# Patient Record
Sex: Female | Born: 1965 | Race: White | Hispanic: No | Marital: Married
Health system: Southern US, Community
[De-identification: ages and names within clinical notes are randomized; demographics above are authoritative.]

## PROBLEM LIST (undated history)

## (undated) DIAGNOSIS — N63 Unspecified lump in unspecified breast: Secondary | ICD-10-CM

---

## 2002-02-23 ENCOUNTER — Other Ambulatory Visit: Admission: RE | Admit: 2002-02-23 | Discharge: 2002-02-23 | Payer: Self-pay | Admitting: Family Medicine

## 2006-10-12 ENCOUNTER — Emergency Department: Payer: Self-pay | Admitting: Emergency Medicine

## 2009-11-26 ENCOUNTER — Ambulatory Visit: Payer: Self-pay

## 2009-12-03 ENCOUNTER — Ambulatory Visit: Payer: Self-pay | Admitting: Unknown Physician Specialty

## 2009-12-17 ENCOUNTER — Ambulatory Visit: Payer: Self-pay | Admitting: Surgery

## 2009-12-23 ENCOUNTER — Ambulatory Visit: Payer: Self-pay | Admitting: Surgery

## 2012-05-26 ENCOUNTER — Ambulatory Visit: Payer: Self-pay

## 2012-05-26 LAB — HEMATOCRIT: HCT: 41 % (ref 35.0–47.0)

## 2012-06-03 ENCOUNTER — Ambulatory Visit: Payer: Self-pay

## 2012-06-03 LAB — PREGNANCY, URINE: Pregnancy Test, Urine: NEGATIVE m[IU]/mL

## 2012-06-20 LAB — PATHOLOGY REPORT

## 2014-10-01 ENCOUNTER — Ambulatory Visit: Payer: Self-pay | Admitting: Unknown Physician Specialty

## 2015-02-19 NOTE — Op Note (Signed)
PATIENT NAME:  Sophia Turner, Sophia Turner MR#:  [id number] DATE OF BIRTH:  October 14, 1966  DATE OF PROCEDURE:  06/03/2012  PREOPERATIVE DIAGNOSIS:  Giant endocervical polyp.   POSTOPERATIVE DIAGNOSIS:  Giant endocervical polyp.   OPERATION PERFORMED: Dilation and curettage, hysteroscopy, removal of polyp.   SURGEON: Wonda Cheng. Laurey Morale, M.D.   OPERATIVE FINDINGS: Giant endocervical polyp.   DESCRIPTION OF PROCEDURE: After adequate general anesthesia, the patient was prepped and draped in routine fashion. The giant endocervical polyp was easily removed by twisting; therefore, a LEEP cone biopsy was not performed. The cervix was dilated with ease. The uterine cavity was systematically visualized with visualization of no obvious abnormality. Endometrial curettage was performed with return of a moderate amount of normal-appearing tissue. The patient tolerated the procedure well and left the operating room in good condition. Sponge and needle counts were said to be correct at the end of the procedure.     ____________________________ Wonda Cheng. Laurey Morale, MD pjr:bjt D: 06/03/2012 09:02:41 ET T: 06/03/2012 12:28:54 ET JOB#: [id number]  cc: Wonda Cheng. Laurey Morale, MD, <Dictator> Rosina Lowenstein MD ELECTRONICALLY SIGNED 06/05/2012 8:27

## 2015-02-25 LAB — SURGICAL PATHOLOGY

## 2016-05-18 ENCOUNTER — Other Ambulatory Visit: Payer: Self-pay | Admitting: Obstetrics and Gynecology

## 2016-05-20 ENCOUNTER — Other Ambulatory Visit: Payer: Self-pay | Admitting: Obstetrics & Gynecology

## 2016-05-20 DIAGNOSIS — N63 Unspecified lump in unspecified breast: Secondary | ICD-10-CM

## 2016-05-28 ENCOUNTER — Ambulatory Visit
Admission: RE | Admit: 2016-05-28 | Discharge: 2016-05-28 | Disposition: A | Discharge status: Home / Self Care | Payer: BLUE CROSS/BLUE SHIELD | Source: Ambulatory Visit | Attending: Obstetrics & Gynecology | Admitting: Obstetrics & Gynecology

## 2016-05-28 DIAGNOSIS — N63 Unspecified lump in unspecified breast: Secondary | ICD-10-CM

## 2016-05-28 HISTORY — DX: Unspecified lump in unspecified breast: N63.0

## 2017-08-30 DIAGNOSIS — D239 Other benign neoplasm of skin, unspecified: Secondary | ICD-10-CM

## 2017-08-30 HISTORY — DX: Other benign neoplasm of skin, unspecified: D23.9

## 2018-04-15 IMAGING — MG MM DIGITAL DIAGNOSTIC BILAT W/ TOMO W/ CAD
8 of 15 series · 8 of 35 positions shown · non-contrast
Comparison: Previous exam(s).

ADDENDUM:
This addendum is created to correct an error in the "RECOMMENDATION"
section. It is noted that the patient is currently 50 years old.

RECOMMENDATION: Screening mammogram in one year.(Code:45-2-BLR)
CLINICAL DATA: Palpable area of concern in the 6 o'clock region of
the right breast. Due for annual exam.
EXAM:
2D DIGITAL DIAGNOSTIC BILATERAL MAMMOGRAM WITH ADJUNCT TOMO
ULTRASOUND RIGHT BREAST

[R TAN synth-2D]
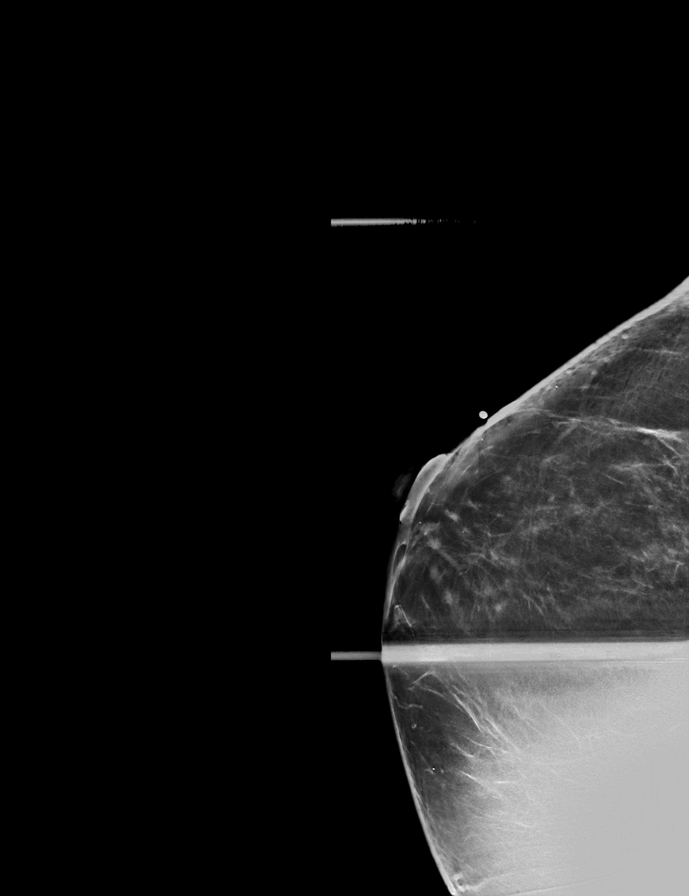

[L MLO]
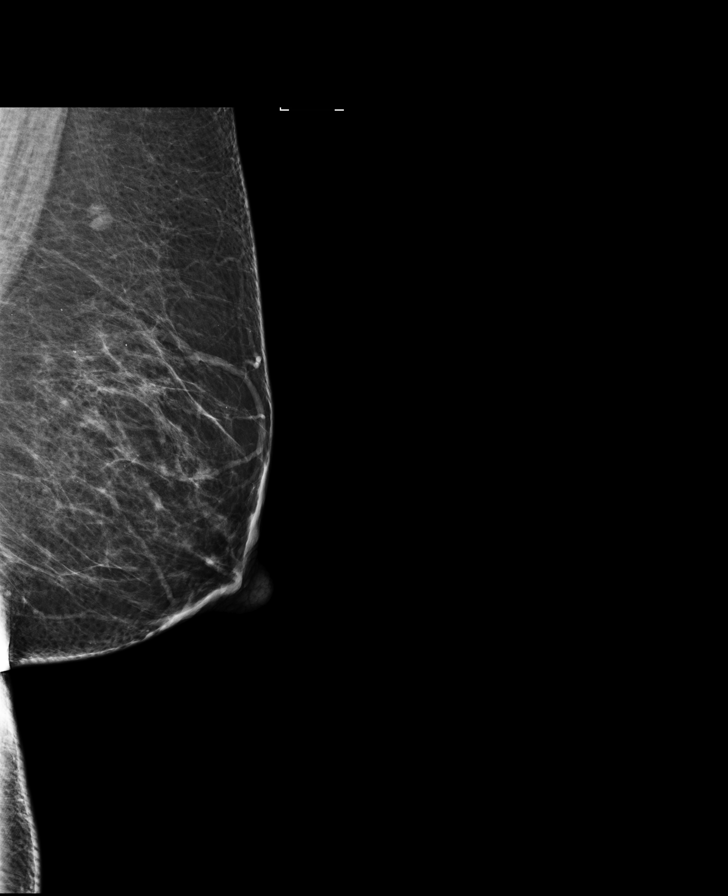

[R CC synth-2D]
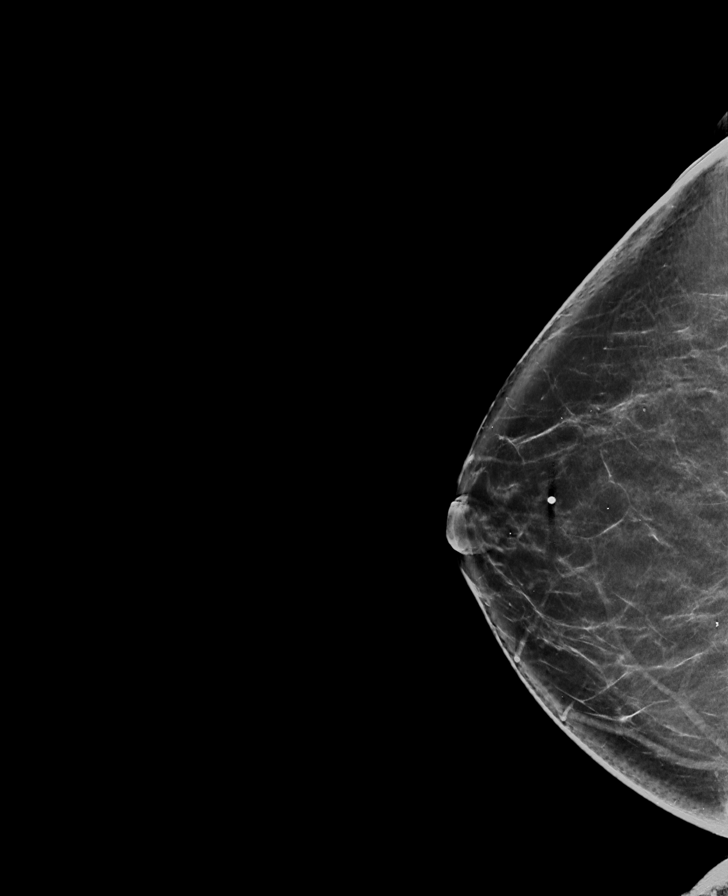

[L MLO synth-2D]
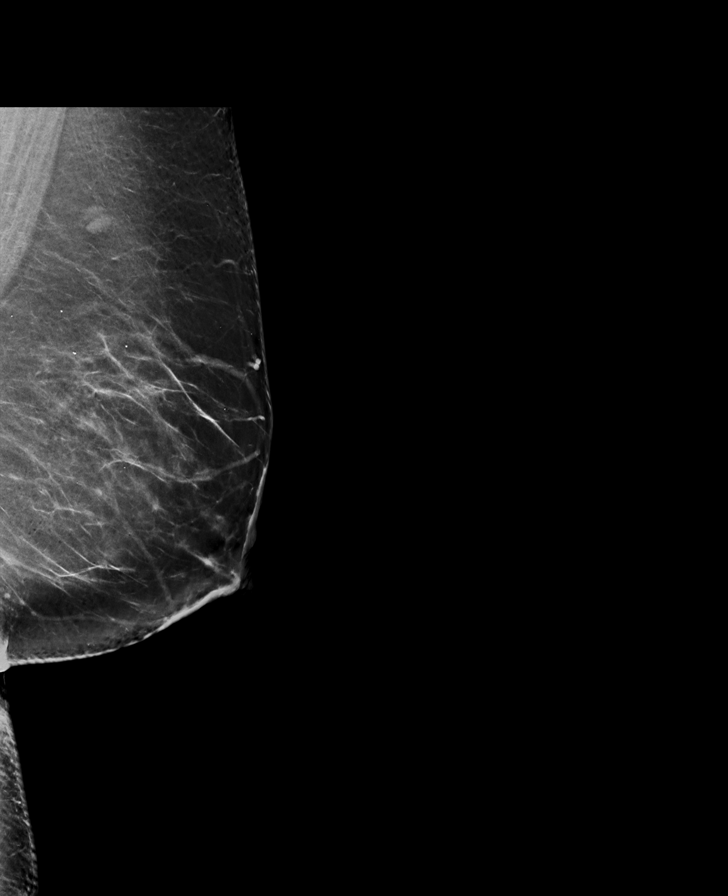

[R MLO synth-2D]
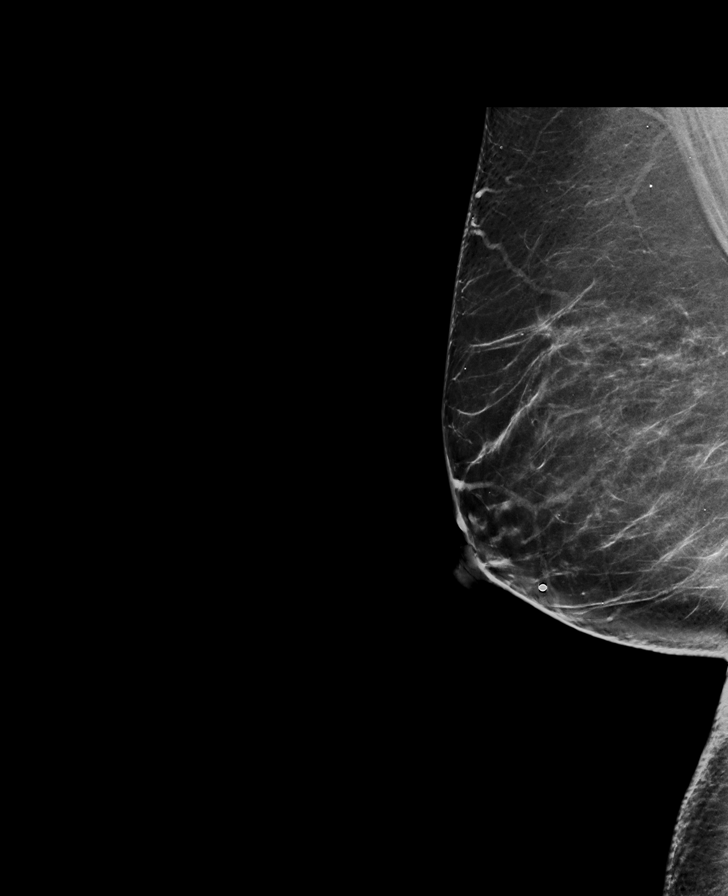

[R MLO]
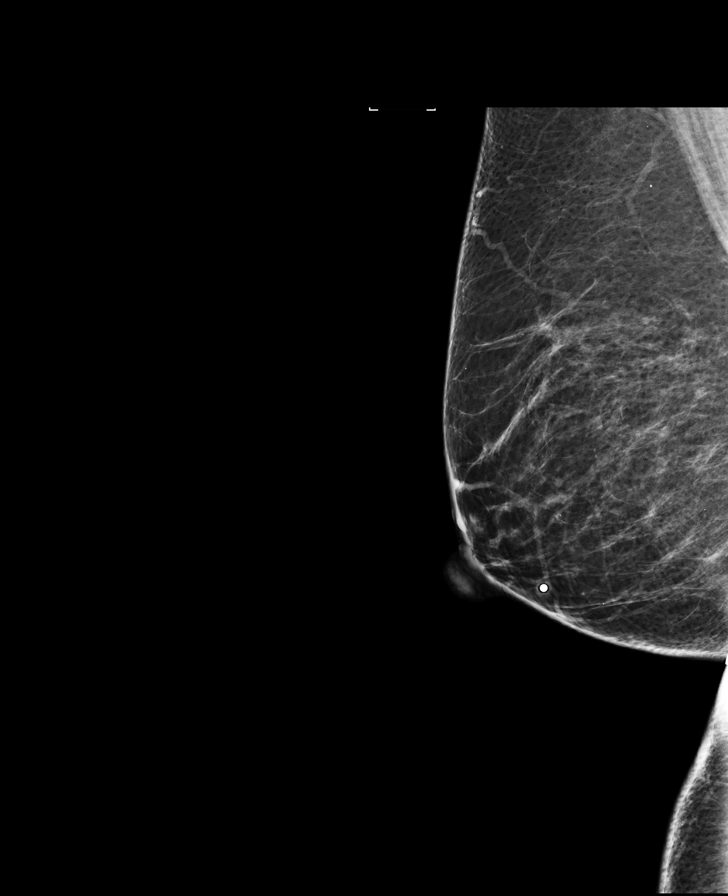

[L CC]
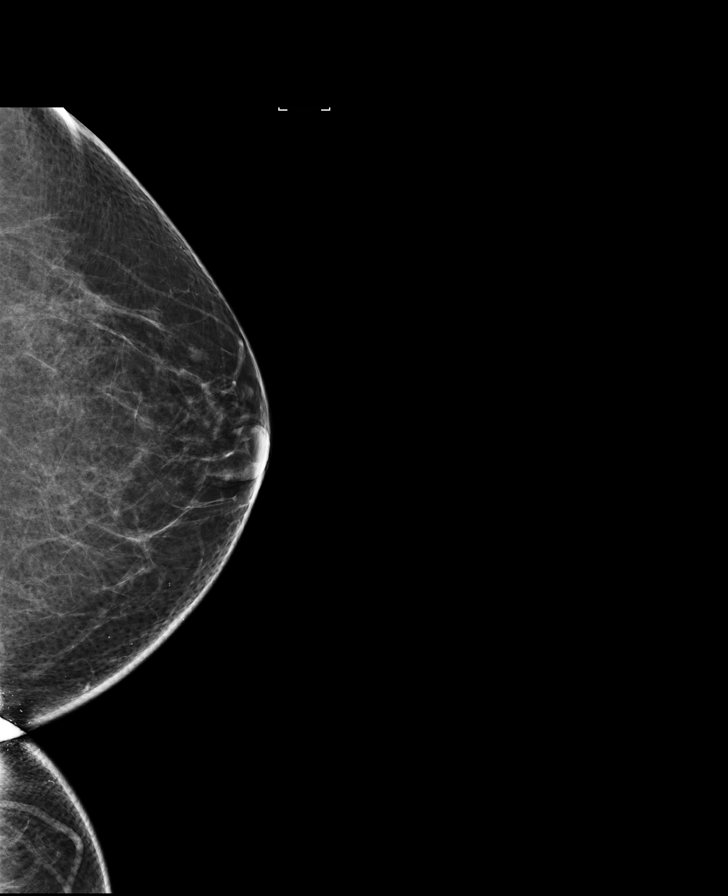

[R TAN]
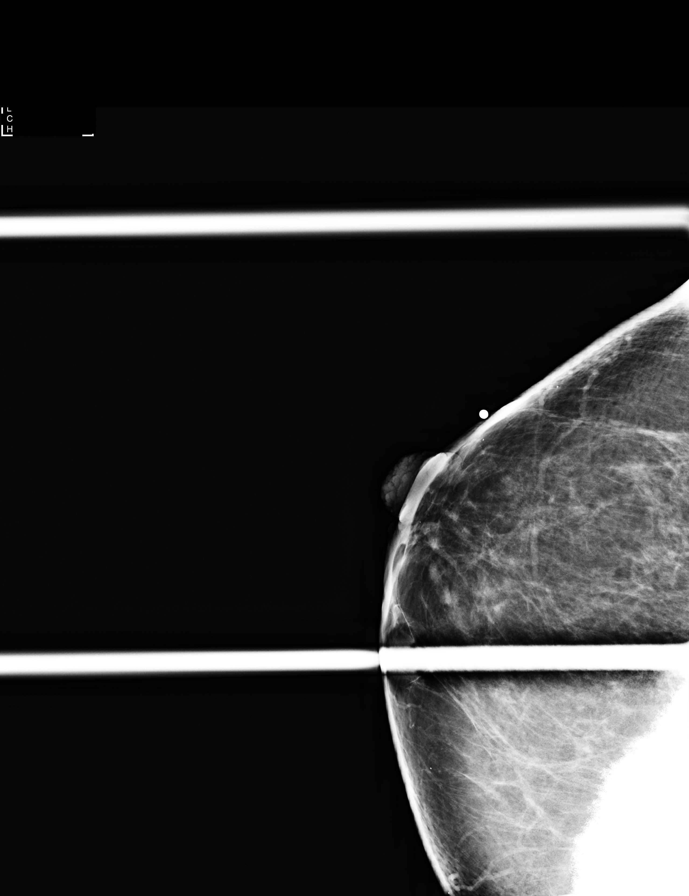

[8 of 35 positions shown; findings below may reference images not displayed]

ACR Breast Density Category b: There are scattered areas of
fibroglandular density.
FINDINGS: A metallic skin marker was placed in the region of palpable concern
in the right breast. A spot tangential view of this region of the
right breast shows predominately fatty breast parenchyma. There is
no mass or distortion.

No mass, distortion, or suspicious microcalcification is identified
in either breast to suggest malignancy.

On physical exam, I palpate a smooth ridgelike area in the 6 o'clock
region of the right breast in the area of patient concern. No
discrete mass is palpated.

Targeted ultrasound is performed, showing predominately fatty breast
parenchyma. No solid or cystic mass or abnormal shadowing is
identified. The area of palpable concern corresponds to normal
appearing breast parenchyma.
IMPRESSION: No evidence of malignancy in either breast.

RECOMMENDATION:
Screening mammogram at age 41 unless there are persistent or
intervening clinical concerns. (Code:JL-N-TEB)

I have discussed the findings and recommendations with the patient.
Results were also provided in writing at the conclusion of the
visit. If applicable, a reminder letter will be sent to the patient
regarding the next appointment.

BI-RADS CATEGORY  1: Negative.

## 2019-06-14 ENCOUNTER — Other Ambulatory Visit: Payer: Self-pay | Admitting: Family Medicine

## 2019-06-14 DIAGNOSIS — Z1231 Encounter for screening mammogram for malignant neoplasm of breast: Secondary | ICD-10-CM

## 2019-07-12 ENCOUNTER — Inpatient Hospital Stay: Admission: RE | Admit: 2019-07-12 | Payer: BLUE CROSS/BLUE SHIELD | Source: Ambulatory Visit

## 2019-08-08 ENCOUNTER — Ambulatory Visit
Admission: RE | Admit: 2019-08-08 | Discharge: 2019-08-08 | Disposition: A | Discharge status: Home / Self Care | Payer: Managed Care, Other (non HMO) | Source: Ambulatory Visit | Attending: Family Medicine | Admitting: Family Medicine

## 2019-08-08 ENCOUNTER — Encounter (INDEPENDENT_AMBULATORY_CARE_PROVIDER_SITE_OTHER): Payer: Self-pay

## 2019-08-08 DIAGNOSIS — Z1231 Encounter for screening mammogram for malignant neoplasm of breast: Secondary | ICD-10-CM | POA: Diagnosis present

## 2020-10-09 ENCOUNTER — Ambulatory Visit: Payer: Self-pay | Admitting: Advanced Practice Midwife

## 2021-12-02 ENCOUNTER — Encounter: Payer: Self-pay | Admitting: Dermatology

## 2021-12-03 ENCOUNTER — Other Ambulatory Visit: Payer: Self-pay

## 2021-12-03 ENCOUNTER — Ambulatory Visit (INDEPENDENT_AMBULATORY_CARE_PROVIDER_SITE_OTHER): Payer: BC Managed Care – PPO | Admitting: Dermatology

## 2021-12-03 DIAGNOSIS — L821 Other seborrheic keratosis: Secondary | ICD-10-CM

## 2021-12-03 DIAGNOSIS — B360 Pityriasis versicolor: Secondary | ICD-10-CM

## 2021-12-03 DIAGNOSIS — D225 Melanocytic nevi of trunk: Secondary | ICD-10-CM

## 2021-12-03 DIAGNOSIS — L578 Other skin changes due to chronic exposure to nonionizing radiation: Secondary | ICD-10-CM | POA: Diagnosis not present

## 2021-12-03 DIAGNOSIS — Z86018 Personal history of other benign neoplasm: Secondary | ICD-10-CM | POA: Diagnosis not present

## 2021-12-03 DIAGNOSIS — L820 Inflamed seborrheic keratosis: Secondary | ICD-10-CM

## 2021-12-03 DIAGNOSIS — Z1283 Encounter for screening for malignant neoplasm of skin: Secondary | ICD-10-CM

## 2021-12-03 DIAGNOSIS — D1800 Hemangioma unspecified site: Secondary | ICD-10-CM

## 2021-12-03 DIAGNOSIS — D485 Neoplasm of uncertain behavior of skin: Secondary | ICD-10-CM

## 2021-12-03 DIAGNOSIS — L814 Other melanin hyperpigmentation: Secondary | ICD-10-CM

## 2021-12-03 DIAGNOSIS — D229 Melanocytic nevi, unspecified: Secondary | ICD-10-CM

## 2021-12-03 MED ORDER — KETOCONAZOLE 2 % EX SHAM: 1.0000 "application " | MEDICATED_SHAMPOO | CUTANEOUS | 6 refills | Status: DC

## 2021-12-03 NOTE — Progress Notes (Signed)
New Patient Visit  Subjective  Sophia Turner is a 56 y.o. female who presents for the following: Annual Exam (History of dysplastic nevus - TBSE today). The patient presents for Total-Body Skin Exam (TBSE) for skin cancer screening and mole check.  The patient has spots, moles and lesions to be evaluated, some may be new or changing and the patient has concerns that these could be cancer.  The following portions of the chart were reviewed this encounter and updated as appropriate:   Allergies   Meds   Problems   Med Hx   Surg Hx   Fam Hx      Review of Systems:  No other skin or systemic complaints except as noted in HPI or Assessment and Plan.  Objective  Well appearing patient in no apparent distress; mood and affect are within normal limits.  A full examination was performed including scalp, head, eyes, ears, nose, lips, neck, chest, axillae, abdomen, back, buttocks, bilateral upper extremities, bilateral lower extremities, hands, feet, fingers, toes, fingernails, and toenails. All findings within normal limits unless otherwise noted below.  Pink patches  trunk, neck (22) Erythematous stuck-on, waxy papule or plaque  Right Upper Back 4.5 cm lat to spine 0.7 cm irregular brown macule        Assessment & Plan   Lentigines - Scattered tan macules - Due to sun exposure - Benign-appearing, observe - Recommend daily broad spectrum sunscreen SPF 30+ to sun-exposed areas, reapply every 2 hours as needed. - Call for any changes  Seborrheic Keratoses - Stuck-on, waxy, tan-brown papules and/or plaques  - Benign-appearing - Discussed benign etiology and prognosis. - Observe - Call for any changes  Melanocytic Nevi - Tan-brown and/or pink-flesh-colored symmetric macules and papules - Benign appearing on exam today - Observation - Call clinic for new or changing moles - Recommend daily use of broad spectrum spf 30+ sunscreen to sun-exposed areas.   Hemangiomas -  Red papules - Discussed benign nature - Observe - Call for any changes  Actinic Damage - Chronic condition, secondary to cumulative UV/sun exposure - diffuse scaly erythematous macules with underlying dyspigmentation - Recommend daily broad spectrum sunscreen SPF 30+ to sun-exposed areas, reapply every 2 hours as needed.  - Staying in the shade or wearing long sleeves, sun glasses (UVA+UVB protection) and wide brim hats (4-inch brim around the entire circumference of the hat) are also recommended for sun protection.  - Call for new or changing lesions.  Skin cancer screening performed today.  Tinea versicolor  Tinea versicolor is a chronic recurrent skin rash causing discolored scaly spots most commonly seen on back, chest, and/or shoulders.  It is generally asymptomatic. The rash is due to overgrowth of a common type of yeast present on everyone's skin and it is not contagious.  It tends to flare more in the summer due to increased sweating on trunk.  After rash is treated, the scaliness will resolve, but the discoloration will take longer to return to normal pigmentation. The periodic use of an OTC medicated soap/shampoo with zinc or selenium sulfide can be helpful to prevent yeast overgrowth and recurrence.   Start Ketoconazole 2% shampoo Wash trunk 2-3 times per week x 2 months then once monthly  ketoconazole (NIZORAL) 2 % shampoo Apply 1 application topically as directed. Wash trunk 2-3 times per week for 2 months, then once a month. Leave on a few minutes then rinse off.  Neoplasm of uncertain behavior of skin Right Upper Back 4.5 cm  lat to spine  Epidermal / dermal shaving  Lesion diameter (cm):  0.7 Informed consent: discussed and consent obtained   Timeout: patient name, date of birth, surgical site, and procedure verified   Procedure prep:  Patient was prepped and draped in usual sterile fashion Prep type:  Isopropyl alcohol Anesthesia: the lesion was anesthetized in a  standard fashion   Anesthetic:  1% lidocaine w/ epinephrine 1-100,000 buffered w/ 8.4% NaHCO3 Instrument used: flexible razor blade   Hemostasis achieved with: pressure, aluminum chloride and electrodesiccation   Outcome: patient tolerated procedure well   Post-procedure details: sterile dressing applied and wound care instructions given   Dressing type: bandage and petrolatum    Specimen 1 - Surgical pathology Differential Diagnosis: Nevus vs dysplastic nevus Check Margins: No  Inflamed seborrheic keratosis (22) trunk, neck  Destruction of lesion - trunk, neck Complexity: simple   Destruction method: cryotherapy   Informed consent: discussed and consent obtained   Timeout:  patient name, date of birth, surgical site, and procedure verified Lesion destroyed using liquid nitrogen: Yes   Region frozen until ice ball extended beyond lesion: Yes   Outcome: patient tolerated procedure well with no complications   Post-procedure details: wound care instructions given    Skin cancer screening   Return in about 3 months (around 03/02/2022).  I, Ashok Cordia, CMA, am acting as scribe for Sarina Ser, MD . Documentation: I have reviewed the above documentation for accuracy and completeness, and I agree with the above.  Sarina Ser, MD

## 2021-12-03 NOTE — Patient Instructions (Addendum)
Tinea versicolor is a chronic recurrent skin rash causing discolored scaly spots most commonly seen on back, chest, and/or shoulders.  It is generally asymptomatic. The rash is due to overgrowth of a common type of yeast present on everyone's skin and it is not contagious.  It tends to flare more in the summer due to increased sweating on trunk.  After rash is treated, the scaliness will resolve, but the discoloration will take longer to return to normal pigmentation. The periodic use of an OTC medicated soap/shampoo with zinc or selenium sulfide can be helpful to prevent yeast overgrowth and recurrence.   Cryotherapy Aftercare  Wash gently with soap and water everyday.   Apply Vaseline and Band-Aid daily until healed.     Wound Care Instructions  Cleanse wound gently with soap and water once a day then pat dry with clean gauze. Apply a thing coat of Petrolatum (petroleum jelly, "Vaseline") over the wound (unless you have an allergy to this). We recommend that you use a new, sterile tube of Vaseline. Do not pick or remove scabs. Do not remove the yellow or white "healing tissue" from the base of the wound.  Cover the wound with fresh, clean, nonstick gauze and secure with paper tape. You may use Band-Aids in place of gauze and tape if the would is small enough, but would recommend trimming much of the tape off as there is often too much. Sometimes Band-Aids can irritate the skin.  You should call the office for your biopsy report after 1 week if you have not already been contacted.  If you experience any problems, such as abnormal amounts of bleeding, swelling, significant bruising, significant pain, or evidence of infection, please call the office immediately.  FOR ADULT SURGERY PATIENTS: If you need something for pain relief you may take 1 extra strength Tylenol (acetaminophen) AND 2 Ibuprofen (200mg  each) together every 4 hours as needed for pain. (do not take these if you are allergic to them  or if you have a reason you should not take them.) Typically, you may only need pain medication for 1 to 3 days.    If You Need Anything After Your Visit  If you have any questions or concerns for your doctor, please call our main line at [phone] and press option 4 to reach your doctor's medical assistant. If no one answers, please leave a voicemail as directed and we will return your call as soon as possible. Messages left after 4 pm will be answered the following business day.   You may also send [place] a message via [place]. We typically respond to MyChart messages within 1-2 business days.  For prescription refills, please ask your pharmacy to contact our office. Our fax number is [phone].  If you have an urgent issue when the clinic is closed that cannot wait until the next business day, you can page your doctor at the number below.    Please note that while we do our best to be available for urgent issues outside of office hours, we are not available 24/7.   If you have an urgent issue and are unable to reach [place], you may choose to seek medical care at your doctor's office, retail clinic, urgent care center, or emergency room.  If you have a medical emergency, please immediately call 911 or go to the emergency department.  Pager Numbers  - Dr. Nehemiah Massed: [phone]  - Dr. Laurence Ferrari: [phone]  - Dr. Nicole Kindred: [phone]  In the event of inclement  weather, please call our main line at [phone] for an update on the status of any delays or closures.  Dermatology Medication Tips: Please keep the boxes that topical medications come in in order to help keep track of the instructions about where and how to use these. Pharmacies typically print the medication instructions only on the boxes and not directly on the medication tubes.   If your medication is too expensive, please contact our office at [phone] option 4 or send [place] a message through [place].   We are unable to  tell what your co-pay for medications will be in advance as this is different depending on your insurance coverage. However, we may be able to find a substitute medication at lower cost or fill out paperwork to get insurance to cover a needed medication.   If a prior authorization is required to get your medication covered by your insurance company, please allow [place] 1-2 business days to complete this process.  Drug prices often vary depending on where the prescription is filled and some pharmacies may offer cheaper prices.  The website www.goodrx.com contains coupons for medications through different pharmacies. The prices here do not account for what the cost may be with help from insurance (it may be cheaper with your insurance), but the website can give you the price if you did not use any insurance.  - You can print the associated coupon and take it with your prescription to the pharmacy.  - You may also stop by our office during regular business hours and pick up a GoodRx coupon card.  - If you need your prescription sent electronically to a different pharmacy, notify our office through [facility] or by phone at [phone] option 4.     Si Usted Necesita Algo Despus de Su Visita  Tambin puede enviarnos un mensaje a travs de Pharmacist, community. Por lo general respondemos a los mensajes de MyChart en el transcurso de 1 a 2 das hbiles.  Para renovar recetas, por favor pida a su farmacia que se ponga en contacto con nuestra oficina. Harland Dingwall de fax es [place] [phone].  Si tiene un asunto urgente cuando la clnica est cerrada y que no puede esperar hasta el siguiente da hbil, puede llamar/localizar a su doctor(a) al nmero que aparece a continuacin.   Por favor, tenga en cuenta que aunque hacemos todo lo posible para estar disponibles para asuntos urgentes fuera del horario de [place], no estamos disponibles las 24 horas del da, los 7 das de la [place].   Si tiene un problema  urgente y no puede comunicarse con nosotros, puede optar por buscar atencin mdica  en el consultorio de su doctor(a), en una clnica privada, en un centro de atencin urgente o en una sala de emergencias.  Si tiene Engineering geologist, por favor llame inmediatamente al 911 o vaya a la sala de emergencias.  Nmeros de bper  - Dr. Nehemiah Massed: [phone]  - Dra. Moye: [phone]  - Dra. Nicole Kindred: [phone]  En caso de inclemencias del [place], por favor llame a Johnsie Kindred principal al [phone] para una actualizacin sobre el [place] de cualquier retraso o cierre.  Consejos para la medicacin en dermatologa: Por favor, guarde las cajas en las que vienen los medicamentos de uso tpico para ayudarle a seguir las instrucciones sobre dnde y cmo usarlos. Las farmacias generalmente imprimen las instrucciones del medicamento slo en las cajas y no directamente en los tubos del [place].   Si su medicamento es [employer]  caro, por favor, pngase en contacto con nuestra oficina llamando al [phone] y presione la opcin 4 o envenos un mensaje a travs de Pharmacist, community.   No podemos decirle cul ser su copago por los medicamentos por adelantado ya que esto es diferente dependiendo de la cobertura de su seguro. Sin embargo, es posible que podamos encontrar un medicamento sustituto a Electrical engineer un formulario para que el seguro cubra el medicamento que se considera necesario.   Si se requiere una autorizacin previa para que su compaa de seguros [place] su medicamento, por favor permtanos de 1 a 2 das hbiles para completar este proceso.  Los precios de los medicamentos varan con frecuencia dependiendo del Environmental consultant de dnde se surte la receta y alguna farmacias pueden ofrecer precios ms baratos.  El sitio web www.goodrx.com tiene cupones para medicamentos de Airline pilot. Los precios aqu no tienen en cuenta lo que podra costar con la ayuda del seguro (puede ser ms barato con  su seguro), pero el sitio web puede darle el precio si no utiliz Research scientist (physical sciences).  - Puede imprimir el cupn correspondiente y llevarlo con su receta a la farmacia.  - Tambin puede pasar por nuestra oficina durante el horario de atencin regular y Charity fundraiser una tarjeta de cupones de GoodRx.  - Si necesita que su receta se enve electrnicamente a una farmacia diferente, informe a nuestra oficina a travs de MyChart de [place] o por telfono llamando al [phone] y presione la opcin 4.

## 2021-12-04 ENCOUNTER — Encounter: Payer: Self-pay | Admitting: Dermatology

## 2021-12-08 ENCOUNTER — Encounter: Payer: Self-pay | Admitting: Dermatology

## 2021-12-08 ENCOUNTER — Telehealth: Payer: Self-pay

## 2021-12-08 NOTE — Telephone Encounter (Signed)
-----   Message from Ralene Bathe, MD sent at 12/06/2021  3:30 PM EST ----- Diagnosis Skin , right upper back 4.5cm lat to spine DYSPLASTIC COMPOUND NEVUS WITH SEVERE ATYPIA, DEEP MARGIN INVOLVED, SEE DESCRIPTION  Severe dysplastic Schedule surgery

## 2021-12-08 NOTE — Telephone Encounter (Signed)
Discussed biopsy results with pt, surgery scheduled

## 2022-01-27 ENCOUNTER — Other Ambulatory Visit: Payer: Self-pay

## 2022-01-27 ENCOUNTER — Encounter: Payer: Self-pay | Admitting: Dermatology

## 2022-01-27 ENCOUNTER — Ambulatory Visit (INDEPENDENT_AMBULATORY_CARE_PROVIDER_SITE_OTHER): Payer: BC Managed Care – PPO | Admitting: Dermatology

## 2022-01-27 DIAGNOSIS — D235 Other benign neoplasm of skin of trunk: Secondary | ICD-10-CM | POA: Diagnosis not present

## 2022-01-27 DIAGNOSIS — D239 Other benign neoplasm of skin, unspecified: Secondary | ICD-10-CM

## 2022-01-27 DIAGNOSIS — D485 Neoplasm of uncertain behavior of skin: Secondary | ICD-10-CM

## 2022-01-27 MED ORDER — MUPIROCIN 2 % EX OINT: 1.0000 "application " | TOPICAL_OINTMENT | Freq: Every day | CUTANEOUS | 0 refills | Status: AC

## 2022-01-27 NOTE — Progress Notes (Signed)
? ?  Follow-Up Visit ?  ?Subjective  ?Sophia Turner is a 56 y.o. female who presents for the following: Severe dysplastic nevus, bx proven ( right upper back 4.5cm lat to spine, pt presents for excision). ? ?The following portions of the chart were reviewed this encounter and updated as appropriate:  ? Allergies  Meds  Problems  Med Hx  Surg Hx  Fam Hx   ?  ?Review of Systems:  No other skin or systemic complaints except as noted in HPI or Assessment and Plan. ? ?Objective  ?Well appearing patient in no apparent distress; mood and affect are within normal limits. ? ?A focused examination was performed including back. Relevant physical exam findings are noted in the Assessment and Plan. ? ?right upper back 4.5cm lat to spine ?Pink bx site ? ?Left Upper Back ?Irregular brown macule ? ? ?Assessment & Plan  ?Dysplastic nevus ?right upper back 4.5cm lat to spine ? ?Severe, bx proven ?Excised today ?Start Mupirocin oint qd to excision site ? ?Skin excision - right upper back 4.5cm lat to spine ? ?Lesion length (cm):  0.8 ?Lesion width (cm):  0.6 ?Margin per side (cm):  0.2 ?Total excision diameter (cm):  1.2 ? ?Skin repair - right upper back 4.5cm lat to spine ?Complexity:  Complex ?Final length (cm):  3 ?Reason for type of repair: reduce tension to allow closure, reduce the risk of dehiscence, infection, and necrosis, reduce subcutaneous dead space and avoid a hematoma, allow closure of the large defect, preserve normal anatomy, preserve normal anatomical and functional relationships and enhance both functionality and cosmetic results   ?Undermining: area extensively undermined   ?Undermining comment:  Undermining defect 1.2 cm ?Subcutaneous layers (deep stitches):  ?Suture size:  2-0 ?Suture type: Vicryl (polyglactin 910)   ?Subcutaneous suture technique: inverted dermal. ?Fine/surface layer approximation (top stitches):  ?Suture size:  3-0 ?Suture type: nylon   ?Stitches: simple running   ?Suture removal  (days):  7 ?Hemostasis achieved with: suture and pressure ?Outcome: patient tolerated procedure well with no complications   ?Post-procedure details: sterile dressing applied and wound care instructions given   ?Dressing type: bandage and pressure dressing (mupirocin)   ? ?mupirocin ointment (BACTROBAN) 2 % - right upper back 4.5cm lat to spine ?Apply 1 application. topically daily. With dressing changes ? ?Specimen 1 - Surgical pathology ?Differential Diagnosis: D48.5 Bx proven severe dysplastic nevus ? ?Check Margins: yes ?Pink bx site ?[id number] ? ?Neoplasm of uncertain behavior of skin ?Left Upper Back ? ?Will plan shave removal at post op appointment ? ? ?Return in about 1 week (around 02/03/2022) for suture removal and shave removal. ? ?I, Ashok Cordia, CMA, am acting as scribe for Sarina Ser, MD . ?Documentation: I have reviewed the above documentation for accuracy and completeness, and I agree with the above. ? ?Sarina Ser, MD ? ?

## 2022-01-27 NOTE — Patient Instructions (Signed)
Wound Care Instructions ? ?On the day following your surgery, you should begin doing daily dressing changes: ?Remove the old dressing and discard it. ?Cleanse the wound gently with tap water. This may be done in the shower or by placing a wet gauze pad directly on the wound and letting it soak for several minutes. ?It is important to gently remove any dried blood from the wound in order to encourage healing. This may be done by gently rolling a moistened Q-tip on the dried blood. Do not pick at the wound. ?If the wound should start to bleed, continue cleaning the wound, then place a moist gauze pad on the wound and hold pressure for a few minutes.  ?Make sure you then dry the skin surrounding the wound completely or the tape will not stick to the skin. Do not use cotton balls on the wound. ?After the wound is clean and dry, apply the ointment gently with a Q-tip. ?Cut a non-stick pad to fit the size of the wound. Lay the pad flush to the wound. If the wound is draining, you may want to reinforce it with a small amount of gauze on top of the non-stick pad for a little added compression to the area. ?Use the tape to seal the area completely. ?Select from the following with respect to your individual situation: ?If your wound has been stitched closed: continue the above steps 1-8 at least daily until your sutures are removed. ?If your wound has been left open to heal: continue steps 1-8 at least daily for the first 3-4 weeks. ?We would like for you to take a few extra precautions for at least the next week. ?Sleep with your head elevated on pillows if our wound is on your head. ?Do not bend over or lift heavy items to reduce the chance of elevated blood pressure to the wound ?Do not participate in particularly strenuous activities. ? ? ?Below is a list of dressing supplies you might need.  ?Cotton-tipped applicators - Q-tips ?Gauze pads (2x2 and/or 4x4) - All-Purpose Sponges ?Non-stick dressing material - Telfa ?Tape -  Paper or Hypafix ?New and clean tube of petroleum jelly - Vaseline  ? ? ?Comments on Post-Operative Period ?Slight swelling and redness often appear around the wound. This is normal and will disappear within several days following the surgery. ?The healing wound will drain a brownish-red-yellow discharge during healing. This is a normal phase of wound healing. As the wound begins to heal, the drainage may increase in amount. Again, this drainage is normal. ?Notify us if the drainage becomes persistently bloody, excessively swollen, or intensely painful or develops a foul odor or red streaks.  ?If you should experience mild discomfort during the healing phase, you may take an aspirin-free medication such as Tylenol (acetaminophen). Notify us if the discomfort is severe or persistent. Avoid alcoholic beverages when taking pain medicine. ? ?In Case of Wound Hemorrhage ?A wound hemorrhage is when the bandage suddenly becomes soaked with bright red blood and flows profusely. If this happens, sit down or lie down with your head elevated. If the wound has a dressing on it, do not remove the dressing. Apply pressure to the existing gauze. If the wound is not covered, use a gauze pad to apply pressure and continue applying the pressure for 20 minutes without peeking. DO NOT COVER THE WOUND WITH A LARGE TOWEL OR WASH CLOTH. Release your hand from the wound site but do not remove the dressing. If the bleeding has stopped,   gently clean around the wound. Leave the dressing in place for 24 hours if possible. This wait time allows the blood vessels to close off so that you do not spark a new round of bleeding by disrupting the newly clotted blood vessels with an immediate dressing change. If the bleeding does not subside, continue to hold pressure. If matters are out of your control, contact an After Hours clinic or go to the Emergency Room. ? ? ?If You Need Anything After Your Visit ? ?If you have any questions or concerns for  your doctor, please call our main line at [phone] and press option 4 to reach your doctor's medical assistant. If no one answers, please leave a voicemail as directed and we will return your call as soon as possible. Messages left after 4 pm will be answered the following business day.  ? ?You may also send [place] a message via MyChart. We typically respond to MyChart messages within 1-2 business days. ? ?For prescription refills, please ask your pharmacy to contact our office. Our fax number is [phone]. ? ?If you have an urgent issue when the clinic is closed that cannot wait until the next business day, you can page your doctor at the number below.   ? ?Please note that while we do our best to be available for urgent issues outside of office hours, we are not available 24/7.  ? ?If you have an urgent issue and are unable to reach [place], you may choose to seek medical care at your doctor's office, retail clinic, urgent care center, or emergency room. ? ?If you have a medical emergency, please immediately call 911 or go to the emergency department. ? ?Pager Numbers ? ?- Dr. Nehemiah Massed: [phone] ? ?- Dr. Laurence Ferrari: [phone] ? ?- Dr. Nicole Kindred: [phone] ? ?In the event of inclement weather, please call our main line at [phone] for an update on the status of any delays or closures. ? ?Dermatology Medication Tips: ?Please keep the boxes that topical medications come in in order to help keep track of the instructions about where and how to use these. Pharmacies typically print the medication instructions only on the boxes and not directly on the medication tubes.  ? ?If your medication is too expensive, please contact our office at [phone] option 4 or send [place] a message through [place].  ? ?We are unable to tell what your co-pay for medications will be in advance as this is different depending on your insurance coverage. However, we may be able to find a substitute medication at lower cost or fill out  paperwork to get insurance to cover a needed medication.  ? ?If a prior authorization is required to get your medication covered by your insurance company, please allow [place] 1-2 business days to complete this process. ? ?Drug prices often vary depending on where the prescription is filled and some pharmacies may offer cheaper prices. ? ?The website www.goodrx.com contains coupons for medications through different pharmacies. The prices here do not account for what the cost may be with help from insurance (it may be cheaper with your insurance), but the website can give you the price if you did not use any insurance.  ?- You can print the associated coupon and take it with your prescription to the pharmacy.  ?- You may also stop by our office during regular business hours and pick up a GoodRx coupon card.  ?- If you need your prescription sent electronically to a different pharmacy, notify our office  through [facility] or by phone at [phone] option 4. ? ? ? ? ?Si Usted Necesita Algo Despu?s de Su Visita ? ?Tambi?n puede enviarnos un mensaje a trav?s de MyChart. Por lo general respondemos a los mensajes de MyChart en el transcurso de 1 a 2 d?as h?biles. ? ?Para renovar recetas, por favor pida a su farmacia que se ponga en contacto con nuestra oficina. Nuestro n?mero de fax es el [phone]. ? ?Si tiene un asunto urgente cuando la cl?nica est? cerrada y que no puede esperar hasta el siguiente d?a h?bil, puede llamar/localizar a su doctor(a) al n?mero que aparece a continuaci?n.  ? ?Por favor, tenga en cuenta que aunque hacemos todo lo posible para estar disponibles para asuntos urgentes fuera del horario de oficina, no estamos disponibles las 24 horas del d?a, los 7 d?as de la semana.  ? ?Si tiene un problema urgente y no puede comunicarse con nosotros, puede optar por buscar atenci?n m?dica  en el consultorio de su doctor(a), en una cl?nica privada, en un centro de atenci?n urgente o en una sala de  emergencias. ? ?Si tiene Engineer, maintenance (IT) m?dica, por favor llame inmediatamente al 911 o vaya a la sala de emergencias. ? ?N?meros de b?per ? ?- Dr. Nehemiah Massed: [phone] ? ?- Dra. Moye: [phone] ? ?- Dra.

## 2022-01-29 ENCOUNTER — Encounter: Payer: Self-pay | Admitting: Dermatology

## 2022-02-03 ENCOUNTER — Encounter: Payer: Self-pay | Admitting: Dermatology

## 2022-02-03 ENCOUNTER — Ambulatory Visit (INDEPENDENT_AMBULATORY_CARE_PROVIDER_SITE_OTHER): Payer: BC Managed Care – PPO | Admitting: Dermatology

## 2022-02-03 DIAGNOSIS — D225 Melanocytic nevi of trunk: Secondary | ICD-10-CM

## 2022-02-03 DIAGNOSIS — D239 Other benign neoplasm of skin, unspecified: Secondary | ICD-10-CM

## 2022-02-03 DIAGNOSIS — Z4802 Encounter for removal of sutures: Secondary | ICD-10-CM

## 2022-02-03 DIAGNOSIS — D492 Neoplasm of unspecified behavior of bone, soft tissue, and skin: Secondary | ICD-10-CM

## 2022-02-03 DIAGNOSIS — D235 Other benign neoplasm of skin of trunk: Secondary | ICD-10-CM

## 2022-02-03 NOTE — Patient Instructions (Signed)

## 2022-02-03 NOTE — Progress Notes (Signed)
? ?  Follow-Up Visit ?  ?Subjective  ?Sophia Turner is a 56 y.o. female who presents for the following: Dysplastic nevus margins free, bx proven (right upper back 4.5cm lat to spine, pt presents for suture removal) and Irregular nevus (L upper back, pr presents for bx today). ? ?The following portions of the chart were reviewed this encounter and updated as appropriate:  ? Allergies  Meds  Problems  Med Hx  Surg Hx  Fam Hx   ?  ?Review of Systems:  No other skin or systemic complaints except as noted in HPI or Assessment and Plan. ? ?Objective  ?Well appearing patient in no apparent distress; mood and affect are within normal limits. ? ?A focused examination was performed including back. Relevant physical exam findings are noted in the Assessment and Plan. ? ?L upper back paraspinal ?0.6cm irregular brown macule ? ?right upper back 4.5cm lat to spine ?Healing excision site ? ? ?Assessment & Plan  ?Neoplasm of skin ?L upper back paraspinal ? ?Epidermal / dermal shaving ? ?Lesion diameter (cm):  0.6 ?Informed consent: discussed and consent obtained   ?Timeout: patient name, date of birth, surgical site, and procedure verified   ?Procedure prep:  Patient was prepped and draped in usual sterile fashion ?Prep type:  Isopropyl alcohol ?Anesthesia: the lesion was anesthetized in a standard fashion   ?Anesthetic:  1% lidocaine w/ epinephrine 1-100,000 buffered w/ 8.4% NaHCO3 ?Instrument used: flexible razor blade   ?Hemostasis achieved with: pressure, aluminum chloride and electrodesiccation   ?Outcome: patient tolerated procedure well   ?Post-procedure details: sterile dressing applied and wound care instructions given   ?Dressing type: bandage and petrolatum   ? ?Specimen 1 - Surgical pathology ?Differential Diagnosis: D48.5 Nevus vs Dysplastic nevus ? ?Check Margins: yes ?0.6cm irregular brown macule ? ?Dysplastic nevus ?right upper back 4.5cm lat to spine ? ?Margins free, bx proven ? ?Encounter for Removal  of Sutures ?- Incision site at the right upper back 4.5cm lat to spine is clean, dry and intact ?- Wound cleansed, sutures removed, wound cleansed and steri strips applied.  ?- Discussed pathology results showing Dysplastic nevus margins free  ?- Patient advised to keep steri-strips dry until they fall off. ?- Scars remodel for a full year. ?- Once steri-strips fall off, patient can apply over-the-counter silicone scar cream each night to help with scar remodeling if desired. ?- Patient advised to call with any concerns or if they notice any new or changing lesions.  ? ?Related Medications ?mupirocin ointment (BACTROBAN) 2 % ?Apply 1 application. topically daily. With dressing changes ? ? ?Return for as scheduled. ? ?I, [name], RMA, am acting as scribe for Sarina Ser, MD . ?Documentation: I have reviewed the above documentation for accuracy and completeness, and I agree with the above. ? ?Sarina Ser, MD ? ?

## 2022-02-09 ENCOUNTER — Telehealth: Payer: Self-pay

## 2022-02-09 NOTE — Telephone Encounter (Signed)
Left pt message to call for bx results/sh °

## 2022-02-09 NOTE — Telephone Encounter (Signed)
-----   Message from Ralene Bathe, MD sent at 02/05/2022  6:21 PM EDT ----- ?Diagnosis ?Skin , left upper back paraspinal ?DYSPLASTIC NEVUS WITH MODERATE TO SEVERE ATYPIA, CLOSE TO MARGIN, SEE DESCRIPTION ? ?Moderate to severe dysplastic ?Schedule surgery ?

## 2022-02-10 ENCOUNTER — Telehealth: Payer: Self-pay

## 2022-02-10 NOTE — Telephone Encounter (Signed)
Patient advised of BX results and scheduled for surgery. When she comes in on May 3rd, she would like the two CPT codes needed for surgery so she can call her insurance company for coverage. She has a high deductible. aw ?

## 2022-02-10 NOTE — Telephone Encounter (Signed)
-----   Message from Ralene Bathe, MD sent at 02/05/2022  6:21 PM EDT ----- ?Diagnosis ?Skin , left upper back paraspinal ?DYSPLASTIC NEVUS WITH MODERATE TO SEVERE ATYPIA, CLOSE TO MARGIN, SEE DESCRIPTION ? ?Moderate to severe dysplastic ?Schedule surgery ?

## 2022-03-04 ENCOUNTER — Ambulatory Visit (INDEPENDENT_AMBULATORY_CARE_PROVIDER_SITE_OTHER): Payer: BC Managed Care – PPO | Admitting: Dermatology

## 2022-03-04 DIAGNOSIS — L738 Other specified follicular disorders: Secondary | ICD-10-CM

## 2022-03-04 DIAGNOSIS — D229 Melanocytic nevi, unspecified: Secondary | ICD-10-CM

## 2022-03-04 DIAGNOSIS — D239 Other benign neoplasm of skin, unspecified: Secondary | ICD-10-CM

## 2022-03-04 DIAGNOSIS — D235 Other benign neoplasm of skin of trunk: Secondary | ICD-10-CM | POA: Diagnosis not present

## 2022-03-04 DIAGNOSIS — L821 Other seborrheic keratosis: Secondary | ICD-10-CM

## 2022-03-04 NOTE — Patient Instructions (Signed)

## 2022-03-04 NOTE — Progress Notes (Signed)
? ?  Follow-Up Visit ?  ?Subjective  ?Sophia Turner is a 56 y.o. female who presents for the following: Follow-up (Patient here today for 3 month ISK follow up at [facility]. ). ?Patient does have a few spots at face she would like checked about which she is concerned.  ? ?The following portions of the chart were reviewed this encounter and updated as appropriate:  ? Allergies  Meds  Problems  Med Hx  Surg Hx  Fam Hx   ?  ?Review of Systems:  No other skin or systemic complaints except as noted in HPI or Assessment and Plan. ? ?Objective  ?Well appearing patient in no apparent distress; mood and affect are within normal limits. ? ?A focused examination was performed including trunk, arms, face. Relevant physical exam findings are noted in the Assessment and Plan. ? ?face ?Small yellow papules with a central dell. ? ?left upper back paraspinal ?Healing biopsy site  ? ?Assessment & Plan  ?Sebaceous hyperplasia ?face ?Benign, observe.  ?Patient advised removal would be cosmetic and non-covered. ?Oral isotretinoin would be a treatment option also. ? ?Dysplastic nevus ?left upper back paraspinal ?Keep surgery appointment as scheduled ?Moderate to severe atypia, biopsy proven ? ?Related Medications ?mupirocin ointment (BACTROBAN) 2 % ?Apply 1 application. topically daily. With dressing changes ? ?Seborrheic Keratoses ?- Stuck-on, waxy, tan-brown papules and/or plaques  ?- Benign-appearing ?- Discussed benign etiology and prognosis. ?- Observe ?- Call for any changes ? ?Melanocytic Nevi ?- Tan-brown and/or pink-flesh-colored symmetric macules and papules ?- Benign appearing on exam today ?- Observation ?- Call clinic for new or changing moles ?- Recommend daily use of broad spectrum spf 30+ sunscreen to sun-exposed areas.  ? ?Return for as scheduled. ? ?Graciella Belton, RMA, am acting as scribe for Sarina Ser, MD . ?Documentation: I have reviewed the above documentation for accuracy and completeness,  and I agree with the above. ? ?Sarina Ser, MD ? ?

## 2022-03-17 ENCOUNTER — Encounter: Payer: Self-pay | Admitting: Dermatology

## 2022-04-14 ENCOUNTER — Encounter: Payer: Self-pay | Admitting: Dermatology

## 2022-04-14 ENCOUNTER — Telehealth: Payer: Self-pay

## 2022-04-14 ENCOUNTER — Ambulatory Visit (INDEPENDENT_AMBULATORY_CARE_PROVIDER_SITE_OTHER): Payer: BC Managed Care – PPO | Admitting: Dermatology

## 2022-04-14 DIAGNOSIS — D239 Other benign neoplasm of skin, unspecified: Secondary | ICD-10-CM

## 2022-04-14 DIAGNOSIS — D235 Other benign neoplasm of skin of trunk: Secondary | ICD-10-CM | POA: Diagnosis not present

## 2022-04-14 NOTE — Patient Instructions (Signed)

## 2022-04-14 NOTE — Progress Notes (Signed)
   Follow-Up Visit   Subjective  Sophia Turner is a 56 y.o. female who presents for the following: Procedure (Biopsy proven severe dysplastic nevus of left upper back paraspinal - excise today).  The following portions of the chart were reviewed this encounter and updated as appropriate:   Allergies  Meds  Problems  Med Hx  Surg Hx  Fam Hx     Review of Systems:  No other skin or systemic complaints except as noted in HPI or Assessment and Plan.  Objective  Well appearing patient in no apparent distress; mood and affect are within normal limits.  A focused examination was performed including L upper back paraspinal. Relevant physical exam findings are noted in the Assessment and Plan.  L upper back paraspinal Pink bx site 1.0 x 0.6cm   Assessment & Plan  Dysplastic nevus L upper back paraspinal  With Moderate to Severe Atypia, bx proven, excised today Start Mupirocin oint qd to excision site  Skin excision - L upper back paraspinal  Lesion length (cm):  1 Lesion width (cm):  0.6 Margin per side (cm):  0.2 Total excision diameter (cm):  1.4 Informed consent: discussed and consent obtained   Timeout: patient name, date of birth, surgical site, and procedure verified   Procedure prep:  Patient was prepped and draped in usual sterile fashion Prep type:  Isopropyl alcohol and povidone-iodine Anesthesia: the lesion was anesthetized in a standard fashion   Anesthetic:  1% lidocaine w/ epinephrine 1-100,000 buffered w/ 8.4% NaHCO3 (6cc lido w/ epi, 3cc bupivicaine, Total of 9cc) Instrument used: #15 blade   Hemostasis achieved with: pressure   Hemostasis achieved with comment:  Electrocautery Outcome: patient tolerated procedure well with no complications   Post-procedure details: sterile dressing applied and wound care instructions given   Dressing type: bandage and pressure dressing (Mupirocin)    Skin repair - L upper back paraspinal Complexity:  Complex Final  length (cm):  3 Reason for type of repair: reduce tension to allow closure, reduce the risk of dehiscence, infection, and necrosis, reduce subcutaneous dead space and avoid a hematoma, allow closure of the large defect, preserve normal anatomy, preserve normal anatomical and functional relationships and enhance both functionality and cosmetic results   Undermining: area extensively undermined   Undermining comment:  Undermining Defect 1.4cm Subcutaneous layers (deep stitches):  Suture size:  2-0 Suture type: Vicryl (polyglactin 910)   Subcutaneous suture technique: Inverted Dermal. Fine/surface layer approximation (top stitches):  Suture size:  3-0 Suture type: nylon   Stitches: simple running   Suture removal (days):  7 Hemostasis achieved with: pressure Outcome: patient tolerated procedure well with no complications   Post-procedure details: sterile dressing applied and wound care instructions given   Dressing type: bandage, pressure dressing and bacitracin (Mupirocin)    Specimen 1 - Surgical pathology Differential Diagnosis: D48.5 Bx proven Dysplastic Nevus with Severe Atypia  Check Margins: yes Pink bx site [phone]  Related Medications mupirocin ointment (BACTROBAN) 2 % Apply 1 application. topically daily. With dressing changes   Return in about 1 week (around 04/21/2022).  I, Sophia Turner, RMA, am acting as scribe for Sarina Ser, MD . Documentation: I have reviewed the above documentation for accuracy and completeness, and I agree with the above.  Sarina Ser, MD

## 2022-04-14 NOTE — Telephone Encounter (Signed)
Left pt msg to call if any problems after today's surgery./sh °

## 2022-04-21 ENCOUNTER — Ambulatory Visit (INDEPENDENT_AMBULATORY_CARE_PROVIDER_SITE_OTHER): Payer: BC Managed Care – PPO | Admitting: Dermatology

## 2022-04-21 DIAGNOSIS — Z4802 Encounter for removal of sutures: Secondary | ICD-10-CM

## 2022-04-21 DIAGNOSIS — Z86018 Personal history of other benign neoplasm: Secondary | ICD-10-CM

## 2022-04-21 NOTE — Patient Instructions (Signed)
Due to recent changes in healthcare laws, you may see results of your pathology and/or laboratory studies on MyChart before the doctors have had a chance to review them. We understand that in some cases there may be results that are confusing or concerning to you. Please understand that not all results are received at the same time and often the doctors may need to interpret multiple results in order to provide you with the best plan of care or course of treatment. Therefore, we ask that you please give us 2 business days to thoroughly review all your results before contacting the office for clarification. Should we see a critical lab result, you will be contacted sooner.   If You Need Anything After Your Visit  If you have any questions or concerns for your doctor, please call our main line at [phone] and press option 4 to reach your doctor's medical assistant. If no one answers, please leave a voicemail as directed and we will return your call as soon as possible. Messages left after 4 pm will be answered the following business day.   You may also send us a message via MyChart. We typically respond to MyChart messages within 1-2 business days.  For prescription refills, please ask your pharmacy to contact our office. Our fax number is [phone].  If you have an urgent issue when the clinic is closed that cannot wait until the next business day, you can page your doctor at the number below.    Please note that while we do our best to be available for urgent issues outside of office hours, we are not available 24/7.   If you have an urgent issue and are unable to reach us, you may choose to seek medical care at your doctor's office, retail clinic, urgent care center, or emergency room.  If you have a medical emergency, please immediately call 911 or go to the emergency department.  Pager Numbers  - Dr. Kowalski: [phone]  - Dr. Moye: [phone]  - Dr. Stewart:  [phone]  In the event of inclement weather, please call our main line at [phone] for an update on the status of any delays or closures.  Dermatology Medication Tips: Please keep the boxes that topical medications come in in order to help keep track of the instructions about where and how to use these. Pharmacies typically print the medication instructions only on the boxes and not directly on the medication tubes.   If your medication is too expensive, please contact our office at [phone] option 4 or send us a message through MyChart.   We are unable to tell what your co-pay for medications will be in advance as this is different depending on your insurance coverage. However, we may be able to find a substitute medication at lower cost or fill out paperwork to get insurance to cover a needed medication.   If a prior authorization is required to get your medication covered by your insurance company, please allow us 1-2 business days to complete this process.  Drug prices often vary depending on where the prescription is filled and some pharmacies may offer cheaper prices.  The website www.goodrx.com contains coupons for medications through different pharmacies. The prices here do not account for what the cost may be with help from insurance (it may be cheaper with your insurance), but the website can give you the price if you did not use any insurance.  - You can print the associated coupon and take it with   your prescription to the pharmacy.  - You may also stop by our office during regular business hours and pick up a GoodRx coupon card.  - If you need your prescription sent electronically to a different pharmacy, notify our office through [place] MyChart or by phone at [phone] option 4.     Si Usted Necesita Algo Despus de Su Visita  Tambin puede enviarnos un mensaje a travs de MyChart. Por lo general respondemos a los mensajes de MyChart en el transcurso de 1 a 2  das hbiles.  Para renovar recetas, por favor pida a su farmacia que se ponga en contacto con nuestra oficina. Nuestro nmero de fax es el [phone].  Si tiene un asunto urgente cuando la clnica est cerrada y que no puede esperar hasta el siguiente da hbil, puede llamar/localizar a su doctor(a) al nmero que aparece a continuacin.   Por favor, tenga en cuenta que aunque hacemos todo lo posible para estar disponibles para asuntos urgentes fuera del horario de oficina, no estamos disponibles las 24 horas del da, los 7 das de la semana.   Si tiene un problema urgente y no puede comunicarse con nosotros, puede optar por buscar atencin mdica  en el consultorio de su doctor(a), en una clnica privada, en un centro de atencin urgente o en una sala de emergencias.  Si tiene una emergencia mdica, por favor llame inmediatamente al 911 o vaya a la sala de emergencias.  Nmeros de bper  - Dr. Kowalski: [phone]  - Dra. Moye: [phone]  - Dra. Stewart: [phone]  En caso de inclemencias del tiempo, por favor llame a nuestra lnea principal al [phone] para una actualizacin sobre el estado de cualquier retraso o cierre.  Consejos para la medicacin en dermatologa: Por favor, guarde las cajas en las que vienen los medicamentos de uso tpico para ayudarle a seguir las instrucciones sobre dnde y cmo usarlos. Las farmacias generalmente imprimen las instrucciones del medicamento slo en las cajas y no directamente en los tubos del medicamento.   Si su medicamento es muy caro, por favor, pngase en contacto con nuestra oficina llamando al [phone] y presione la opcin 4 o envenos un mensaje a travs de MyChart.   No podemos decirle cul ser su copago por los medicamentos por adelantado ya que esto es diferente dependiendo de la cobertura de su seguro. Sin embargo, es posible que podamos encontrar un medicamento sustituto a menor costo o llenar un formulario para que el  seguro cubra el medicamento que se considera necesario.   Si se requiere una autorizacin previa para que su compaa de seguros cubra su medicamento, por favor permtanos de 1 a 2 das hbiles para completar este proceso.  Los precios de los medicamentos varan con frecuencia dependiendo del lugar de dnde se surte la receta y alguna farmacias pueden ofrecer precios ms baratos.  El sitio web www.goodrx.com tiene cupones para medicamentos de diferentes farmacias. Los precios aqu no tienen en cuenta lo que podra costar con la ayuda del seguro (puede ser ms barato con su seguro), pero el sitio web puede darle el precio si no utiliz ningn seguro.  - Puede imprimir el cupn correspondiente y llevarlo con su receta a la farmacia.  - Tambin puede pasar por nuestra oficina durante el horario de atencin regular y recoger una tarjeta de cupones de GoodRx.  - Si necesita que su receta se enve electrnicamente a una farmacia diferente, informe a nuestra oficina a travs de MyChart de [place]   o por telfono llamando al [phone] y presione la opcin 4.  

## 2022-04-21 NOTE — Progress Notes (Signed)
   Follow-Up Visit   Subjective  Sophia Turner is a 56 y.o. female who presents for the following: Follow-up (Post op Severe dysplastic nevus of left upper back paraspinal - Excised 04/14/2022).  The following portions of the chart were reviewed this encounter and updated as appropriate:   Allergies  Meds  Problems  Med Hx  Surg Hx  Fam Hx     Review of Systems:  No other skin or systemic complaints except as noted in HPI or Assessment and Plan.  Objective  Well appearing patient in no apparent distress; mood and affect are within normal limits.  A focused examination was performed including back. Relevant physical exam findings are noted in the Assessment and Plan.  Left upper back paraspinal Healing excision site   Assessment & Plan  History of dysplastic nevus Left upper back paraspinal  Encounter for Removal of Sutures - Incision site at the left upper back paraspinal is clean, dry and intact - Wound cleansed, sutures removed, wound cleansed and steri strips applied.  - Discussed pathology results showing Severe dysplastic nevus margins free  - Patient advised to keep steri-strips dry until they fall off. - Scars remodel for a full year. - Once steri-strips fall off, patient can apply over-the-counter silicone scar cream each night to help with scar remodeling if desired. - Patient advised to call with any concerns or if they notice any new or changing lesions.  Return for 6-12 months , TBSE.  Documentation: I have reviewed the above documentation for accuracy and completeness, and I agree with the above.  Sarina Ser, MD

## 2022-04-23 ENCOUNTER — Encounter: Payer: Self-pay | Admitting: Dermatology

## 2023-04-28 ENCOUNTER — Ambulatory Visit (INDEPENDENT_AMBULATORY_CARE_PROVIDER_SITE_OTHER): Payer: BC Managed Care – PPO | Admitting: Dermatology

## 2023-04-28 VITALS — BP 127/64

## 2023-04-28 DIAGNOSIS — D225 Melanocytic nevi of trunk: Secondary | ICD-10-CM

## 2023-04-28 DIAGNOSIS — B360 Pityriasis versicolor: Secondary | ICD-10-CM

## 2023-04-28 DIAGNOSIS — Z1283 Encounter for screening for malignant neoplasm of skin: Secondary | ICD-10-CM | POA: Diagnosis not present

## 2023-04-28 DIAGNOSIS — D216 Benign neoplasm of connective and other soft tissue of trunk, unspecified: Secondary | ICD-10-CM

## 2023-04-28 DIAGNOSIS — L821 Other seborrheic keratosis: Secondary | ICD-10-CM

## 2023-04-28 DIAGNOSIS — D492 Neoplasm of unspecified behavior of bone, soft tissue, and skin: Secondary | ICD-10-CM

## 2023-04-28 DIAGNOSIS — Z86018 Personal history of other benign neoplasm: Secondary | ICD-10-CM

## 2023-04-28 DIAGNOSIS — L820 Inflamed seborrheic keratosis: Secondary | ICD-10-CM

## 2023-04-28 DIAGNOSIS — L578 Other skin changes due to chronic exposure to nonionizing radiation: Secondary | ICD-10-CM

## 2023-04-28 DIAGNOSIS — D229 Melanocytic nevi, unspecified: Secondary | ICD-10-CM

## 2023-04-28 DIAGNOSIS — L304 Erythema intertrigo: Secondary | ICD-10-CM

## 2023-04-28 DIAGNOSIS — Z79899 Other long term (current) drug therapy: Secondary | ICD-10-CM

## 2023-04-28 DIAGNOSIS — L814 Other melanin hyperpigmentation: Secondary | ICD-10-CM

## 2023-04-28 DIAGNOSIS — D1801 Hemangioma of skin and subcutaneous tissue: Secondary | ICD-10-CM

## 2023-04-28 DIAGNOSIS — Z7189 Other specified counseling: Secondary | ICD-10-CM

## 2023-04-28 DIAGNOSIS — W908XXA Exposure to other nonionizing radiation, initial encounter: Secondary | ICD-10-CM

## 2023-04-28 MED ORDER — KETOCONAZOLE 2 % EX SHAM: 1.0000 | MEDICATED_SHAMPOO | CUTANEOUS | 6 refills | Status: DC

## 2023-04-28 NOTE — Patient Instructions (Addendum)
Wound Care Instructions  Cleanse wound gently with soap and water once a day then pat dry with clean gauze. Apply a thin coat of Petrolatum (petroleum jelly, "Vaseline") over the wound (unless you have an allergy to this). We recommend that you use a new, sterile tube of Vaseline. Do not pick or remove scabs. Do not remove the yellow or white "healing tissue" from the base of the wound.  Cover the wound with fresh, clean, nonstick gauze and secure with paper tape. You may use Band-Aids in place of gauze and tape if the wound is small enough, but would recommend trimming much of the tape off as there is often too much. Sometimes Band-Aids can irritate the skin.  You should call the office for your biopsy report after 1 week if you have not already been contacted.  If you experience any problems, such as abnormal amounts of bleeding, swelling, significant bruising, significant pain, or evidence of infection, please call the office immediately.  FOR ADULT SURGERY PATIENTS: If you need something for pain relief you may take 1 extra strength Tylenol (acetaminophen) AND 2 Ibuprofen (200mg each) together every 4 hours as needed for pain. (do not take these if you are allergic to them or if you have a reason you should not take them.) Typically, you may only need pain medication for 1 to 3 days.      Cryotherapy Aftercare  Wash gently with soap and water everyday.   Apply Vaseline and Band-Aid daily until healed.      Due to recent changes in healthcare laws, you may see results of your pathology and/or laboratory studies on MyChart before the doctors have had a chance to review them. We understand that in some cases there may be results that are confusing or concerning to you. Please understand that not all results are received at the same time and often the doctors may need to interpret multiple results in order to provide you with the best plan of care or course of treatment. Therefore, we ask that  you please give us 2 business days to thoroughly review all your results before contacting the office for clarification. Should we see a critical lab result, you will be contacted sooner.   If You Need Anything After Your Visit  If you have any questions or concerns for your doctor, please call our main line at [phone] and press option 4 to reach your doctor's medical assistant. If no one answers, please leave a voicemail as directed and we will return your call as soon as possible. Messages left after 4 pm will be answered the following business day.   You may also send us a message via MyChart. We typically respond to MyChart messages within 1-2 business days.  For prescription refills, please ask your pharmacy to contact our office. Our fax number is [phone].  If you have an urgent issue when the clinic is closed that cannot wait until the next business day, you can page your doctor at the number below.    Please note that while we do our best to be available for urgent issues outside of office hours, we are not available 24/7.   If you have an urgent issue and are unable to reach us, you may choose to seek medical care at your doctor's office, retail clinic, urgent care center, or emergency room.  If you have a medical emergency, please immediately call 911 or go to the emergency department.  Pager Numbers  - Dr.   Kowalski: [phone]  - Dr. Moye: [phone]  - Dr. Stewart: [phone]  In the event of inclement weather, please call our main line at [phone] for an update on the status of any delays or closures.  Dermatology Medication Tips: Please keep the boxes that topical medications come in in order to help keep track of the instructions about where and how to use these. Pharmacies typically print the medication instructions only on the boxes and not directly on the medication tubes.   If your medication is too expensive, please contact our office at  [phone] option 4 or send us a message through MyChart.   We are unable to tell what your co-pay for medications will be in advance as this is different depending on your insurance coverage. However, we may be able to find a substitute medication at lower cost or fill out paperwork to get insurance to cover a needed medication.   If a prior authorization is required to get your medication covered by your insurance company, please allow us 1-2 business days to complete this process.  Drug prices often vary depending on where the prescription is filled and some pharmacies may offer cheaper prices.  The website www.goodrx.com contains coupons for medications through different pharmacies. The prices here do not account for what the cost may be with help from insurance (it may be cheaper with your insurance), but the website can give you the price if you did not use any insurance.  - You can print the associated coupon and take it with your prescription to the pharmacy.  - You may also stop by our office during regular business hours and pick up a GoodRx coupon card.  - If you need your prescription sent electronically to a different pharmacy, notify our office through [facility] MyChart or by phone at [phone] option 4.     Si Usted Necesita Algo Despus de Su Visita  Tambin puede enviarnos un mensaje a travs de MyChart. Por lo general respondemos a los mensajes de MyChart en el transcurso de 1 a 2 das hbiles.  Para renovar recetas, por favor pida a su farmacia que se ponga en contacto con nuestra oficina. Nuestro nmero de fax es el [phone].  Si tiene un asunto urgente cuando la clnica est cerrada y que no puede esperar hasta el siguiente da hbil, puede llamar/localizar a su doctor(a) al nmero que aparece a continuacin.   Por favor, tenga en cuenta que aunque hacemos todo lo posible para estar disponibles para asuntos urgentes fuera del horario de oficina, no estamos  disponibles las 24 horas del da, los 7 das de la semana.   Si tiene un problema urgente y no puede comunicarse con nosotros, puede optar por buscar atencin mdica  en el consultorio de su doctor(a), en una clnica privada, en un centro de atencin urgente o en una sala de emergencias.  Si tiene una emergencia mdica, por favor llame inmediatamente al 911 o vaya a la sala de emergencias.  Nmeros de bper  - Dr. Kowalski: [phone]  - Dra. Moye: [phone]  - Dra. Stewart: [phone]  En caso de inclemencias del tiempo, por favor llame a nuestra lnea principal al [phone] para una actualizacin sobre el estado de cualquier retraso o cierre.  Consejos para la medicacin en dermatologa: Por favor, guarde las cajas en las que vienen los medicamentos de uso tpico para ayudarle a seguir las instrucciones sobre dnde y cmo usarlos. Las farmacias generalmente imprimen las instrucciones del medicamento slo   en las cajas y no directamente en los tubos del medicamento.   Si su medicamento es muy caro, por favor, pngase en contacto con nuestra oficina llamando al [phone] y presione la opcin 4 o envenos un mensaje a travs de MyChart.   No podemos decirle cul ser su copago por los medicamentos por adelantado ya que esto es diferente dependiendo de la cobertura de su seguro. Sin embargo, es posible que podamos encontrar un medicamento sustituto a menor costo o llenar un formulario para que el seguro cubra el medicamento que se considera necesario.   Si se requiere una autorizacin previa para que su compaa de seguros cubra su medicamento, por favor permtanos de 1 a 2 das hbiles para completar este proceso.  Los precios de los medicamentos varan con frecuencia dependiendo del lugar de dnde se surte la receta y alguna farmacias pueden ofrecer precios ms baratos.  El sitio web www.goodrx.com tiene cupones para medicamentos de diferentes farmacias. Los precios aqu no  tienen en cuenta lo que podra costar con la ayuda del seguro (puede ser ms barato con su seguro), pero el sitio web puede darle el precio si no utiliz ningn seguro.  - Puede imprimir el cupn correspondiente y llevarlo con su receta a la farmacia.  - Tambin puede pasar por nuestra oficina durante el horario de atencin regular y recoger una tarjeta de cupones de GoodRx.  - Si necesita que su receta se enve electrnicamente a una farmacia diferente, informe a nuestra oficina a travs de MyChart de  o por telfono llamando al [phone] y presione la opcin 4.  

## 2023-04-28 NOTE — Progress Notes (Signed)
Follow-Up Visit   Subjective  Sophia Turner is a 57 y.o. female who presents for the following: Skin Cancer Screening and Full Body Skin Exam, hx of Dysplastic nevi, check spots L axilla 46m, no symptoms, L groin, ~28yrs, hx of falling off and has come back, hx of TV on back, used Ketoconazole shampoo did not clear, pt says area is tender  The patient presents for Total-Body Skin Exam (TBSE) for skin cancer screening and mole check. The patient has spots, moles and lesions to be evaluated, some may be new or changing and the patient has concerns that these could be cancer.    The following portions of the chart were reviewed this encounter and updated as appropriate: medications, allergies, medical history  Review of Systems:  No other skin or systemic complaints except as noted in HPI or Assessment and Plan.  Objective  Well appearing patient in no apparent distress; mood and affect are within normal limits.  A full examination was performed including scalp, head, eyes, ears, nose, lips, neck, chest, axillae, abdomen, back, buttocks, bilateral upper extremities, bilateral lower extremities, hands, feet, fingers, toes, fingernails, and toenails. All findings within normal limits unless otherwise noted below.   Relevant physical exam findings are noted in the Assessment and Plan.  L mid back below braline 9.0cm lat to spine 1.1 x 0.5cm flesh colored pap     L groin Brown pap 0.8cm     L ant axillary x 1 Stuck on waxy paps with erythema    Assessment & Plan   LENTIGINES, SEBORRHEIC KERATOSES, HEMANGIOMAS - Benign normal skin lesions - Benign-appearing - Call for any changes  MELANOCYTIC NEVI - Tan-brown and/or pink-flesh-colored symmetric macules and papules - Benign appearing on exam today - Observation - Call clinic for new or changing moles - Recommend daily use of broad spectrum spf 30+ sunscreen to sun-exposed areas.   ACTINIC DAMAGE - Chronic  condition, secondary to cumulative UV/sun exposure - diffuse scaly erythematous macules with underlying dyspigmentation - Recommend daily broad spectrum sunscreen SPF 30+ to sun-exposed areas, reapply every 2 hours as needed.  - Staying in the shade or wearing long sleeves, sun glasses (UVA+UVB protection) and wide brim hats (4-inch brim around the entire circumference of the hat) are also recommended for sun protection.  - Call for new or changing lesions.  SKIN CANCER SCREENING PERFORMED TODAY.  HISTORY OF DYSPLASTIC NEVUS No evidence of recurrence today Recommend regular full body skin exams Recommend daily broad spectrum sunscreen SPF 30+ to sun-exposed areas, reapply every 2 hours as needed.  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 Condition is symptomatic/ bothersome to patient. Not currently at goal.    Treatment: Re-start Ketoconazole 2% shampoo 3d/wk Monday, Wednesday, Friday, let sit 5 minutes and rinse off   Tinea versicolor is a chronic recurrent skin rash causing discolored scaly spots most commonly seen on back, chest, and/or shoulders.  It is generally asymptomatic. The rash is due to overgrowth of a common type of yeast present on everyone's skin and it is not contagious.  It tends to flare more in the summer due to increased sweating on trunk.  After rash is treated, the scaliness will resolve, but the discoloration will take longer  to return to normal pigmentation. The periodic use of an OTC medicated soap/shampoo with zinc or selenium sulfide can be helpful to prevent yeast overgrowth and recurrence.   Neoplasm of skin (2) L mid back below braline 9.0cm lat to spine  Epidermal / dermal shaving  Lesion diameter (cm):   1.1 Informed consent: discussed and consent obtained   Timeout: patient name, date of birth, surgical site, and procedure verified   Procedure prep:  Patient was prepped and draped in usual sterile fashion Prep type:  Isopropyl alcohol Anesthesia: the lesion was anesthetized in a standard fashion   Anesthetic:  1% lidocaine w/ epinephrine 1-100,000 buffered w/ 8.4% NaHCO3 Instrument used: flexible razor blade   Hemostasis achieved with: pressure, aluminum chloride and electrodesiccation   Outcome: patient tolerated procedure well   Post-procedure details: sterile dressing applied and wound care instructions given   Dressing type: bandage and bacitracin    Specimen 1 - Surgical pathology Differential Diagnosis: D48.5 Dermatofibroma vs Nevus r/o Dysplasia vs other  Check Margins: yes 1.1 x 0.5cm flesh colored pap  L groin  Epidermal / dermal shaving  Lesion diameter (cm):  0.8 Informed consent: discussed and consent obtained   Timeout: patient name, date of birth, surgical site, and procedure verified   Procedure prep:  Patient was prepped and draped in usual sterile fashion Prep type:  Isopropyl alcohol Anesthesia: the lesion was anesthetized in a standard fashion   Anesthetic:  1% lidocaine w/ epinephrine 1-100,000 buffered w/ 8.4% NaHCO3 Instrument used: scissors   Hemostasis achieved with: pressure, aluminum chloride and electrodesiccation   Outcome: patient tolerated procedure well   Post-procedure details: sterile dressing applied and wound care instructions given   Dressing type: bandage and bacitracin    Specimen 2 - Surgical pathology Differential Diagnosis: D48.5 Irritated Nevus r/o Atypia  Check Margins: yes Brown pap 0.8cm  Inflamed seborrheic keratosis L ant axillary x 1  Symptomatic, irritating, patient would like treated.   Destruction of lesion - L ant axillary x 1 Complexity: simple   Destruction method: cryotherapy   Informed consent: discussed and  consent obtained   Timeout:  patient name, date of birth, surgical site, and procedure verified Lesion destroyed using liquid nitrogen: Yes   Region frozen until ice ball extended beyond lesion: Yes   Outcome: patient tolerated procedure well with no complications   Post-procedure details: wound care instructions given     INTERTRIGO inframammary Exam Erythematous macerated patches  Chronic and persistent condition with duration or expected duration over one year. Condition is symptomatic / bothersome to patient. Not to goal.  Intertrigo is a chronic recurrent rash that occurs in skin fold areas that may be associated with friction; heat; moisture; yeast; fungus; and bacteria.  It is exacerbated by increased movement / activity; sweating; and higher atmospheric temperature.  Treatment Plan Discussed topical txt pt declines  Start Ketoconazole 2% shampoo 3d/wk aa inframammary, let sit 5 minutes and rinse off  If needed in future can send in Ketoconazole 3d/wk Monday, Wednesday, Friday, HC 2.5% cr 3d/wk Tuesday, Thursday, Saturday   Return in about 1 year (around 04/27/2024) for TBSE, Hx of Dysplastic nevi.  I, Ardis Rowan, RMA, am acting as scribe for Armida Sans, MD .   Documentation: I have reviewed the above documentation for accuracy and completeness, and I agree with the above.  Armida Sans, MD

## 2023-04-30 ENCOUNTER — Encounter: Payer: Self-pay | Admitting: Dermatology

## 2023-05-03 ENCOUNTER — Telehealth: Payer: Self-pay

## 2023-05-03 NOTE — Telephone Encounter (Signed)
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

## 2023-05-03 NOTE — Telephone Encounter (Signed)
-----   Message from Deirdre Evener, MD sent at 04/30/2023  6:15 PM EDT ----- Diagnosis 1. 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

## 2024-04-27 ENCOUNTER — Ambulatory Visit: Payer: BC Managed Care – PPO | Admitting: Dermatology

## 2024-05-25 ENCOUNTER — Ambulatory Visit: Admitting: Dermatology

## 2024-05-25 DIAGNOSIS — L578 Other skin changes due to chronic exposure to nonionizing radiation: Secondary | ICD-10-CM | POA: Diagnosis not present

## 2024-05-25 DIAGNOSIS — D229 Melanocytic nevi, unspecified: Secondary | ICD-10-CM

## 2024-05-25 DIAGNOSIS — L820 Inflamed seborrheic keratosis: Secondary | ICD-10-CM | POA: Diagnosis not present

## 2024-05-25 DIAGNOSIS — L814 Other melanin hyperpigmentation: Secondary | ICD-10-CM

## 2024-05-25 DIAGNOSIS — B360 Pityriasis versicolor: Secondary | ICD-10-CM | POA: Diagnosis not present

## 2024-05-25 DIAGNOSIS — D225 Melanocytic nevi of trunk: Secondary | ICD-10-CM

## 2024-05-25 DIAGNOSIS — Z1283 Encounter for screening for malignant neoplasm of skin: Secondary | ICD-10-CM | POA: Diagnosis not present

## 2024-05-25 DIAGNOSIS — Z79899 Other long term (current) drug therapy: Secondary | ICD-10-CM

## 2024-05-25 DIAGNOSIS — D492 Neoplasm of unspecified behavior of bone, soft tissue, and skin: Secondary | ICD-10-CM | POA: Diagnosis not present

## 2024-05-25 DIAGNOSIS — W908XXA Exposure to other nonionizing radiation, initial encounter: Secondary | ICD-10-CM

## 2024-05-25 DIAGNOSIS — Z7189 Other specified counseling: Secondary | ICD-10-CM

## 2024-05-25 DIAGNOSIS — D489 Neoplasm of uncertain behavior, unspecified: Secondary | ICD-10-CM

## 2024-05-25 DIAGNOSIS — Z86018 Personal history of other benign neoplasm: Secondary | ICD-10-CM

## 2024-05-25 MED ORDER — KETOCONAZOLE 2 % EX SHAM: 1.0000 | MEDICATED_SHAMPOO | CUTANEOUS | 11 refills | Status: AC

## 2024-05-25 NOTE — Patient Instructions (Addendum)

## 2024-05-25 NOTE — Progress Notes (Signed)
 Follow-Up Visit   Subjective  Sophia Turner is a 58 y.o. female who presents for the following: Skin Cancer Screening and Full Body Skin Exam Hx of dysplastic nevus , hx of tinea versicolor, using ketoconazole  shampoo   Patient reports some spots at right side of neck left temple, under right eye and right breast she would like checked.    The patient presents for Total-Body Skin Exam (TBSE) for skin cancer screening and mole check. The patient has spots, moles and lesions to be evaluated, some may be new or changing and the patient may have concern these could be cancer.   The following portions of the chart were reviewed this encounter and updated as appropriate: medications, allergies, medical history  Review of Systems:  No other skin or systemic complaints except as noted in HPI or Assessment and Plan.  Objective  Well appearing patient in no apparent distress; mood and affect are within normal limits.  A full examination was performed including scalp, head, eyes, ears, nose, lips, neck, chest, axillae, abdomen, back, buttocks, bilateral upper extremities, bilateral lower extremities, hands, feet, fingers, toes, fingernails, and toenails. All findings within normal limits unless otherwise noted below.   Relevant physical exam findings are noted in the Assessment and Plan.  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 Condition is improving with treatment but not currently at goal.    Treatment: Continue Ketoconazole  2% shampoo 3d/wk Monday, Wednesday, Friday, let sit 5 minutes and rinse off   Tinea versicolor is a chronic recurrent skin rash causing discolored scaly  spots most commonly seen on back, chest, and/or shoulders.  It is generally asymptomatic. The rash is due to overgrowth of a common type of yeast present on everyone's skin and it is not contagious.  It tends to flare more in the summer due to increased sweating on trunk.  After rash is treated, the scaliness will resolve, but the discoloration will take longer to return to normal pigmentation. The periodic use of an OTC medicated soap/shampoo with zinc or selenium sulfide can be helpful to prevent yeast overgrowth and recurrence.   SKIN CANCER SCREENING PERFORMED TODAY.  ACTINIC DAMAGE - Chronic condition, secondary to cumulative UV/sun exposure - diffuse scaly erythematous macules with underlying dyspigmentation - Recommend daily broad spectrum sunscreen SPF 30+ to sun-exposed areas, reapply every 2 hours as needed.  - Staying in the shade or wearing long sleeves, sun glasses (UVA+UVB protection) and wide brim hats (4-inch brim around the entire circumference of the hat) are also recommended for sun protection.  - Call for new or changing lesions.  LENTIGINES, SEBORRHEIC KERATOSES, HEMANGIOMAS - Benign normal skin lesions - Benign-appearing - Call for any changes Hemangioma at right cheek   MELANOCYTIC NEVI - Tan-brown and/or pink-flesh-colored symmetric macules and papules - Benign appearing on exam today - Observation - Call clinic for new or changing moles - Recommend daily use of broad spectrum spf 30+ sunscreen to sun-exposed areas.   Sebaceous Hyperplasia Forehead  - Small yellow papules with a central dell - Benign-appearing - Observe. Call for changes.  HISTORY OF DYSPLASTIC NEVUS 02/03/2022 left upper back paraspinal moderate to severe  12/03/2021 right upper back 4.5 cm lateral to spine - severe - excised 01/27/2022 08/30/2017 - left lateral inferior breast - moderate atypia  No evidence of recurrence today Recommend regular full body skin exams Recommend daily broad  spectrum sunscreen SPF 30+ to sun-exposed areas, reapply every 2 hours as needed.  Call if any new or changing lesions are noted between office visits  INFLAMED SEBORRHEIC KERATOSIS (14) scalp x 2, face x 5 , right breast x 1, left arm x 2, abdomen x 1, left buttock x 3 (14) Symptomatic, irritating, patient would like treated. Destruction of lesion - scalp x 2, face x 5 , right breast x 1, left arm x 2, abdomen x 1, left buttock x 3 (14) Complexity: simple   Destruction method: cryotherapy   Informed consent: discussed and consent obtained   Timeout:  patient name, date of birth, surgical site, and procedure verified Lesion destroyed using liquid nitrogen: Yes   Region frozen until ice ball extended beyond lesion: Yes   Outcome: patient tolerated procedure well with no complications   Post-procedure details: wound care instructions given    NEOPLASM OF UNCERTAIN BEHAVIOR right inframammary Epidermal / dermal shaving  Lesion diameter (cm):  0.6 Informed consent: discussed and consent obtained   Timeout: patient name, date of birth, surgical site, and procedure verified   Procedure prep:  Patient was prepped and draped in usual sterile fashion Prep type:  Isopropyl alcohol Anesthesia: the lesion was anesthetized in a standard fashion   Anesthetic:  1% lidocaine w/ epinephrine 1-100,000 buffered w/ 8.4% NaHCO3 Instrument used: flexible razor blade   Hemostasis achieved with: pressure, aluminum chloride and electrodesiccation   Outcome: patient tolerated procedure well   Post-procedure details: sterile dressing applied and wound care instructions given   Dressing type: bandage and petrolatum    Specimen 1 - Surgical pathology Differential Diagnosis: nevus r/o dysplasia   Check Margins: yes  Nevus r/o dysplasia  TINEA VERSICOLOR   Related Medications ketoconazole  (NIZORAL ) 2 % shampoo Apply 1 Application topically 3 (three) times a week. Wash back and under breast 3 times  weekly, let sit for 5 minutes and rinse off Return in about 1 year (around 05/25/2025) for TBSE.  IEleanor Blush, CMA, am acting as scribe for Alm Rhyme, MD.   Documentation: I have reviewed the above documentation for accuracy and completeness, and I agree with the above.  Alm Rhyme, MD

## 2024-05-29 LAB — SURGICAL PATHOLOGY

## 2024-05-30 ENCOUNTER — Encounter: Payer: Self-pay | Admitting: Dermatology

## 2024-05-30 ENCOUNTER — Ambulatory Visit: Payer: Self-pay | Admitting: Dermatology

## 2024-05-30 NOTE — Telephone Encounter (Signed)
 Patient advised of BX results. aw

## 2024-05-30 NOTE — Telephone Encounter (Addendum)
 Tried calling patient regarding bx results. No answer. LM for patient to return call. ----- Message from Alm Rhyme sent at 05/30/2024 10:14 AM EDT ----- FINAL DIAGNOSIS        1. Skin, right inframammary :       DYSPLASTIC COMPOUND NEVUS WITH MODERATE ATYPIA, DEEP MARGIN INVOLVED   Moderate dysplastic Recheck next visit ----- Message ----- From: Interface, [facility] Sent: 05/29/2024   5:19 PM EDT To: Alm JAYSON Rhyme, MD

## 2025-05-30 ENCOUNTER — Ambulatory Visit: Admitting: Dermatology
